# Patient Record
Sex: Male | Born: 1960 | Race: White | Hispanic: No
Health system: Southern US, Community
[De-identification: ages and names within clinical notes are randomized; demographics above are authoritative.]

## PROBLEM LIST (undated history)

## (undated) DIAGNOSIS — F329 Major depressive disorder, single episode, unspecified: Secondary | ICD-10-CM

## (undated) DIAGNOSIS — F419 Anxiety disorder, unspecified: Secondary | ICD-10-CM

## (undated) DIAGNOSIS — F32A Depression, unspecified: Secondary | ICD-10-CM

---

## 1999-09-01 ENCOUNTER — Ambulatory Visit (HOSPITAL_COMMUNITY): Admission: RE | Admit: 1999-09-01 | Discharge: 1999-09-01 | Payer: Self-pay | Admitting: Gastroenterology

## 1999-11-19 ENCOUNTER — Encounter: Admission: RE | Admit: 1999-11-19 | Discharge: 1999-11-19 | Payer: Self-pay | Admitting: Gastroenterology

## 1999-11-19 ENCOUNTER — Encounter: Payer: Self-pay | Admitting: Gastroenterology

## 2003-12-11 ENCOUNTER — Encounter: Admission: RE | Admit: 2003-12-11 | Discharge: 2003-12-11 | Payer: Self-pay | Admitting: Gastroenterology

## 2004-11-28 ENCOUNTER — Ambulatory Visit (HOSPITAL_COMMUNITY): Admission: RE | Admit: 2004-11-28 | Discharge: 2004-11-28 | Payer: Self-pay | Admitting: Gastroenterology

## 2013-09-22 ENCOUNTER — Other Ambulatory Visit: Payer: Self-pay

## 2017-11-26 ENCOUNTER — Emergency Department (HOSPITAL_COMMUNITY)
Admission: EM | Admit: 2017-11-26 | Discharge: 2017-11-26 | Disposition: A | Discharge status: Home / Self Care | Payer: BLUE CROSS/BLUE SHIELD | Attending: Emergency Medicine | Admitting: Emergency Medicine

## 2017-11-26 ENCOUNTER — Encounter (HOSPITAL_COMMUNITY): Payer: Self-pay

## 2017-11-26 ENCOUNTER — Emergency Department (HOSPITAL_COMMUNITY): Payer: BLUE CROSS/BLUE SHIELD

## 2017-11-26 ENCOUNTER — Other Ambulatory Visit: Payer: Self-pay

## 2017-11-26 DIAGNOSIS — Z23 Encounter for immunization: Secondary | ICD-10-CM | POA: Insufficient documentation

## 2017-11-26 DIAGNOSIS — Y9389 Activity, other specified: Secondary | ICD-10-CM | POA: Insufficient documentation

## 2017-11-26 DIAGNOSIS — Y999 Unspecified external cause status: Secondary | ICD-10-CM | POA: Insufficient documentation

## 2017-11-26 DIAGNOSIS — S80211A Abrasion, right knee, initial encounter: Secondary | ICD-10-CM | POA: Diagnosis not present

## 2017-11-26 DIAGNOSIS — Y929 Unspecified place or not applicable: Secondary | ICD-10-CM | POA: Insufficient documentation

## 2017-11-26 DIAGNOSIS — S50811A Abrasion of right forearm, initial encounter: Secondary | ICD-10-CM | POA: Diagnosis not present

## 2017-11-26 DIAGNOSIS — S59911A Unspecified injury of right forearm, initial encounter: Secondary | ICD-10-CM | POA: Diagnosis present

## 2017-11-26 HISTORY — DX: Depression, unspecified: F32.A

## 2017-11-26 HISTORY — DX: Major depressive disorder, single episode, unspecified: F32.9

## 2017-11-26 HISTORY — DX: Anxiety disorder, unspecified: F41.9

## 2017-11-26 MED ORDER — TETANUS-DIPHTH-ACELL PERTUSSIS 5-2.5-18.5 LF-MCG/0.5 IM SUSP
0.5000 mL | Freq: Once | INTRAMUSCULAR | Status: AC
  Administered 2017-11-26: 0.5 mL via INTRAMUSCULAR
  Filled 2017-11-26: qty 0.5

## 2017-11-26 MED ORDER — IBUPROFEN 800 MG PO TABS
800.0000 mg | ORAL_TABLET | Freq: Once | ORAL | Status: AC
  Administered 2017-11-26: 800 mg via ORAL
  Filled 2017-11-26: qty 1
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
## 2017-11-26 NOTE — ED Notes (Signed)
Discharge instructions reviewed with patient. Patient verbalizes understanding. VSS.   
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
## 2017-12-03 ENCOUNTER — Other Ambulatory Visit: Payer: Self-pay | Admitting: Family Medicine

## 2017-12-03 DIAGNOSIS — S0990XA Unspecified injury of head, initial encounter: Secondary | ICD-10-CM

## 2017-12-03 DIAGNOSIS — R42 Dizziness and giddiness: Secondary | ICD-10-CM

## 2017-12-03 DIAGNOSIS — R410 Disorientation, unspecified: Secondary | ICD-10-CM

## 2017-12-07 ENCOUNTER — Ambulatory Visit
Admission: RE | Admit: 2017-12-07 | Discharge: 2017-12-07 | Disposition: A | Discharge status: Home / Self Care | Payer: BLUE CROSS/BLUE SHIELD | Source: Ambulatory Visit | Attending: Family Medicine | Admitting: Family Medicine

## 2017-12-07 DIAGNOSIS — S0990XA Unspecified injury of head, initial encounter: Secondary | ICD-10-CM

## 2017-12-07 DIAGNOSIS — R42 Dizziness and giddiness: Secondary | ICD-10-CM

## 2017-12-07 DIAGNOSIS — R410 Disorientation, unspecified: Secondary | ICD-10-CM

## 2017-12-07 MED ORDER — GADOBENATE DIMEGLUMINE 529 MG/ML IV SOLN
20.0000 mL | Freq: Once | INTRAVENOUS | Status: AC | PRN
  Administered 2017-12-07: 20 mL via INTRAVENOUS

## 2018-06-24 DIAGNOSIS — R972 Elevated prostate specific antigen [PSA]: Secondary | ICD-10-CM | POA: Diagnosis not present

## 2018-07-01 DIAGNOSIS — N401 Enlarged prostate with lower urinary tract symptoms: Secondary | ICD-10-CM | POA: Diagnosis not present

## 2018-07-01 DIAGNOSIS — R972 Elevated prostate specific antigen [PSA]: Secondary | ICD-10-CM | POA: Diagnosis not present

## 2018-07-01 DIAGNOSIS — R350 Frequency of micturition: Secondary | ICD-10-CM | POA: Diagnosis not present

## 2018-07-15 DIAGNOSIS — R1031 Right lower quadrant pain: Secondary | ICD-10-CM | POA: Diagnosis not present

## 2018-07-20 ENCOUNTER — Other Ambulatory Visit: Payer: Self-pay | Admitting: Family Medicine

## 2018-07-20 DIAGNOSIS — R1031 Right lower quadrant pain: Secondary | ICD-10-CM

## 2018-08-05 ENCOUNTER — Other Ambulatory Visit: Payer: BLUE CROSS/BLUE SHIELD

## 2019-02-03 ENCOUNTER — Other Ambulatory Visit: Payer: Self-pay

## 2019-02-03 ENCOUNTER — Encounter (HOSPITAL_COMMUNITY): Payer: Self-pay | Admitting: Emergency Medicine

## 2019-02-03 ENCOUNTER — Other Ambulatory Visit: Payer: Self-pay | Admitting: Nurse Practitioner

## 2019-02-03 ENCOUNTER — Ambulatory Visit (HOSPITAL_BASED_OUTPATIENT_CLINIC_OR_DEPARTMENT_OTHER): Payer: 59

## 2019-02-03 ENCOUNTER — Emergency Department (HOSPITAL_COMMUNITY)
Admission: EM | Admit: 2019-02-03 | Discharge: 2019-02-04 | Disposition: A | Discharge status: Home / Self Care | Payer: 59 | Attending: Emergency Medicine | Admitting: Emergency Medicine

## 2019-02-03 ENCOUNTER — Other Ambulatory Visit (HOSPITAL_BASED_OUTPATIENT_CLINIC_OR_DEPARTMENT_OTHER): Payer: Self-pay | Admitting: Nurse Practitioner

## 2019-02-03 ENCOUNTER — Encounter (HOSPITAL_BASED_OUTPATIENT_CLINIC_OR_DEPARTMENT_OTHER): Payer: Self-pay

## 2019-02-03 DIAGNOSIS — R1011 Right upper quadrant pain: Secondary | ICD-10-CM

## 2019-02-03 DIAGNOSIS — Z79899 Other long term (current) drug therapy: Secondary | ICD-10-CM | POA: Diagnosis not present

## 2019-02-03 DIAGNOSIS — K8050 Calculus of bile duct without cholangitis or cholecystitis without obstruction: Secondary | ICD-10-CM | POA: Insufficient documentation

## 2019-02-03 LAB — CBC
HCT: 45.9 % (ref 39.0–52.0)
Hemoglobin: 15.9 g/dL (ref 13.0–17.0)
MCH: 31 pg (ref 26.0–34.0)
MCHC: 34.6 g/dL (ref 30.0–36.0)
MCV: 89.5 fL (ref 80.0–100.0)
Platelets: 203 10*3/uL (ref 150–400)
RBC: 5.13 MIL/uL (ref 4.22–5.81)
RDW: 12.1 % (ref 11.5–15.5)
WBC: 7 10*3/uL (ref 4.0–10.5)
nRBC: 0 % (ref 0.0–0.2)

## 2019-02-03 LAB — URINALYSIS, ROUTINE W REFLEX MICROSCOPIC
Bacteria, UA: NONE SEEN
Bilirubin Urine: NEGATIVE
Glucose, UA: >=500 mg/dL — AB
Hgb urine dipstick: NEGATIVE
Ketones, ur: NEGATIVE mg/dL
Nitrite: NEGATIVE
Protein, ur: NEGATIVE mg/dL
Specific Gravity, Urine: 1.028 (ref 1.005–1.030)
pH: 5 (ref 5.0–8.0)

## 2019-02-03 LAB — COMPREHENSIVE METABOLIC PANEL
ALT: 27 U/L (ref 0–44)
AST: 21 U/L (ref 15–41)
Albumin: 4 g/dL (ref 3.5–5.0)
Alkaline Phosphatase: 58 U/L (ref 38–126)
Anion gap: 10 (ref 5–15)
BUN: 24 mg/dL — ABNORMAL HIGH (ref 6–20)
CO2: 21 mmol/L — ABNORMAL LOW (ref 22–32)
Calcium: 9.2 mg/dL (ref 8.9–10.3)
Chloride: 106 mmol/L (ref 98–111)
Creatinine, Ser: 1.1 mg/dL (ref 0.61–1.24)
GFR calc Af Amer: >60 mL/min (ref >60)
GFR calc non Af Amer: >60 mL/min (ref >60)
Glucose, Bld: 245 mg/dL — ABNORMAL HIGH (ref 70–99)
Potassium: 3.9 mmol/L (ref 3.5–5.1)
Sodium: 137 mmol/L (ref 135–145)
Total Bilirubin: 0.4 mg/dL (ref 0.3–1.2)
Total Protein: 6.6 g/dL (ref 6.5–8.1)

## 2019-02-03 LAB — LIPASE, BLOOD: Lipase: 45 U/L (ref 11–51)

## 2019-02-03 MED ORDER — SODIUM CHLORIDE 0.9% FLUSH: 3.0000 mL | Freq: Once | INTRAVENOUS | Status: DC

## 2019-02-03 NOTE — ED Triage Notes (Signed)
Pt c/o RUQ pain since Monday night. Endorses nausea/vomiting after eating.

## 2019-02-04 ENCOUNTER — Emergency Department (HOSPITAL_COMMUNITY): Payer: 59

## 2019-02-04 MED ORDER — METOCLOPRAMIDE HCL 10 MG PO TABS: 10.0000 mg | ORAL_TABLET | Freq: Four times a day (QID) | ORAL | 0 refills | Status: AC

## 2019-02-04 MED ORDER — ONDANSETRON 4 MG PO TBDP
8.0000 mg | ORAL_TABLET | Freq: Once | ORAL | Status: AC
  Administered 2019-02-04: 8 mg via ORAL
  Filled 2019-02-04: qty 2

## 2019-02-04 MED ORDER — DICYCLOMINE HCL 20 MG PO TABS: 20.0000 mg | ORAL_TABLET | Freq: Two times a day (BID) | ORAL | 0 refills | Status: AC | PRN

## 2019-02-04 NOTE — ED Provider Notes (Signed)
[facility] Provider Note   CSN: [id number] Arrival date & time: 02/03/19  1803     History   Chief Complaint Chief Complaint  Patient presents with  . Abdominal Pain    HPI ROBER Marks is a 58 y.o. male.     58 year old male presents to the emergency department for evaluation of right upper quadrant abdominal pain which began 5 days ago.  It is intermittent and waxing and waning in severity.  Is usually present 1 hour after eating or drinking.  He has had associated nausea and vomiting with his last episode of emesis on Tuesday.  Denies taking any medications for his pain, but has been told that he has gallstones in the past.  No fevers, hematemesis, bowel changes, urinary symptoms.  No history of abdominal surgeries.  The history is provided by the patient. No language interpreter was used.  Abdominal Pain   Past Medical History:  Diagnosis Date  . Anxiety   . Depression     There are no active problems to display for this patient.   History reviewed. No pertinent surgical history.      Home Medications    Prior to Admission medications   Medication Sig Start Date End Date Taking? Authorizing Provider  ALPRAZolam Duanne Moron) 0.5 MG tablet Take 0.5 mg by mouth 2 (two) times daily as needed. 11/02/17   [provider]  dicyclomine (BENTYL) 20 MG tablet Take 1 tablet (20 mg total) by mouth every 12 (twelve) hours as needed (for abdominal pain/cramping). 02/04/19   Antonietta Breach, PA-C  metoCLOPramide (REGLAN) 10 MG tablet Take 1 tablet (10 mg total) by mouth every 6 (six) hours. 02/04/19   Antonietta Breach, PA-C  pantoprazole (PROTONIX) 20 MG tablet Take 20 mg by mouth daily. 09/14/17   [provider]  sertraline (ZOLOFT) 50 MG tablet Take 50 mg by mouth daily.    [provider]    Family History Family History  Problem Relation Age of Onset  . Cancer Mother   . Heart failure Father     Social History  Social History   Tobacco Use  . Smoking status: Never Smoker  . Smokeless tobacco: Never Used  Substance Use Topics  . Alcohol use: Yes    Comment: occasionally  . Drug use: Never     Allergies   Tobramycin   Review of Systems Review of Systems  Gastrointestinal: Positive for abdominal pain.   Ten systems reviewed and are negative for acute change, except as noted in the HPI.    Physical Exam Updated Vital Signs BP 132/88 (BP Location: Right Arm)   Pulse 70   Temp 98 F (36.7 C) (Oral)   Resp 18   SpO2 98%   Physical Exam Vitals signs and nursing note reviewed.  Constitutional:      General: He is not in acute distress.    Appearance: He is well-developed. He is not diaphoretic.     Comments: Nontoxic appearing and in NAD  HENT:     Head: Normocephalic and atraumatic.  Eyes:     General: No scleral icterus.    Conjunctiva/sclera: Conjunctivae normal.  Neck:     Musculoskeletal: Normal range of motion.  Pulmonary:     Effort: Pulmonary effort is normal. No respiratory distress.  Abdominal:     Comments: Mild RUQ TTP without guarding or peritoneal signs. Negative Murphy's sign. No palpable masses.  Musculoskeletal: Normal range of motion.  Skin:  General: Skin is warm and dry.     Coloration: Skin is not pale.     Findings: No erythema or rash.  Neurological:     Mental Status: He is alert and oriented to person, place, and time.     Coordination: Coordination normal.  Psychiatric:        Behavior: Behavior normal.      ED Treatments / Results  Labs (all labs ordered are listed, but only abnormal results are displayed) Labs Reviewed  COMPREHENSIVE METABOLIC PANEL - Abnormal; Notable for the following components:      Result Value   CO2 21 (*)    Glucose, Bld 245 (*)    BUN 24 (*)    All other components within normal limits  URINALYSIS, ROUTINE W REFLEX MICROSCOPIC - Abnormal; Notable for the following components:   Glucose, UA >=500 (*)     Leukocytes,Ua TRACE (*)    All other components within normal limits  LIPASE, BLOOD  CBC    EKG None  Radiology [place] Abdomen Limited  Result Date: 02/04/2019 CLINICAL DATA:  Right upper quadrant abdominal pain EXAM: ULTRASOUND ABDOMEN LIMITED RIGHT UPPER QUADRANT COMPARISON:  Ultrasound dated December 11, 2003 could not be retrieved for comparison. The report is available for review. FINDINGS: Gallbladder: There is cholelithiasis without gallbladder wall thickening or pericholecystic free fluid. The sonographic Eulah PontMurphy sign is reported as negative. Common bile duct: Diameter: 4 mm Liver: Diffuse increased echogenicity with slightly heterogeneous liver. Appearance typically secondary to fatty infiltration. Fibrosis secondary consideration. No secondary findings of cirrhosis noted. No focal hepatic lesion or intrahepatic biliary duct dilatation. Portal vein is patent on color Doppler imaging with normal direction of blood flow towards the liver. Other: None. IMPRESSION: 1. There is cholelithiasis without secondary signs of acute cholecystitis. 2. Hepatic steatosis. Electronically Signed   By: Katherine Mantlehristopher  Green M.D.   On: 02/04/2019 01:05    Procedures Procedures (including critical care time)  Medications Ordered in ED Medications  sodium chloride flush (NS) 0.9 % injection 3 mL (has no administration in time range)  ondansetron (ZOFRAN-ODT) disintegrating tablet 8 mg (8 mg Oral Given 02/04/19 0208)     Initial Impression / Assessment and Plan / ED Course  I have reviewed the triage vital signs and the nursing notes.  Pertinent labs & imaging results that were available during my care of the patient were reviewed by me and considered in my medical decision making (see chart for details).        58 year old male presents to the emergency department for evaluation of right upper quadrant abdominal pain x 5 days.  Pain is largely postprandial with occasional nausea and vomiting.  He had mild  tenderness in the right upper quadrant.  Negative Murphy sign today.  No associated fevers.  Vital signs have been stable.  Labs also reassuring without leukocytosis.  Liver and kidney function preserved.  An ultrasound was completed which shows gallstones.  There is no wall thickening or pericholecystic fluid.  His symptoms today are consistent with biliary colic.  I have recommended outpatient management with antiemetics and Bentyl.  He will be given referral to general surgery to discuss possible elective cholecystectomy.  Return precautions discussed and provided. Patient discharged in stable condition with no unaddressed concerns.   Final Clinical Impressions(s) / ED Diagnoses   Final diagnoses:  Biliary colic    ED Discharge Orders         Ordered    metoCLOPramide (REGLAN) 10 MG tablet  Every 6 hours     02/04/19 0224    dicyclomine (BENTYL) 20 MG tablet  Every 12 hours PRN     02/04/19 0224           Antony MaduraHumes, Kynlie Jane, PA-C 02/04/19 0410    Mesner, Barbara CowerJason, MD 02/04/19 [phone]

## 2019-02-04 NOTE — Discharge Instructions (Signed)
Your work-up in the emergency department was reassuring.  You do have evidence of gallstones, but no infection to your gallbladder.  Your liver tests were normal.  We recommend follow-up with general surgery to discuss elective cholecystectomy.  You may take Reglan as needed for persistent nausea and Bentyl as prescribed for management of pain.  Try to avoid fatty, greasy, fried foods as this will exacerbate your symptoms.  You may return to the ED if symptoms persist or worsen.

## 2019-02-06 ENCOUNTER — Other Ambulatory Visit: Payer: Self-pay

## 2019-02-07 ENCOUNTER — Other Ambulatory Visit: Payer: Self-pay | Admitting: Nurse Practitioner

## 2019-02-21 ENCOUNTER — Other Ambulatory Visit: Payer: Self-pay | Admitting: General Surgery

## 2020-03-25 IMAGING — US ULTRASOUND ABDOMEN LIMITED
1 series · 14 of 25 positions shown · non-contrast
Comparison: Ultrasound dated December 11, 2003 could not be retrieved
for comparison. The report is available for review.

CLINICAL DATA: Right upper quadrant abdominal pain

EXAM:
ULTRASOUND ABDOMEN LIMITED RIGHT UPPER QUADRANT

[Series 1: ultrasound abdomen limited · 14 of 61 slices shown]
[im 1/61]
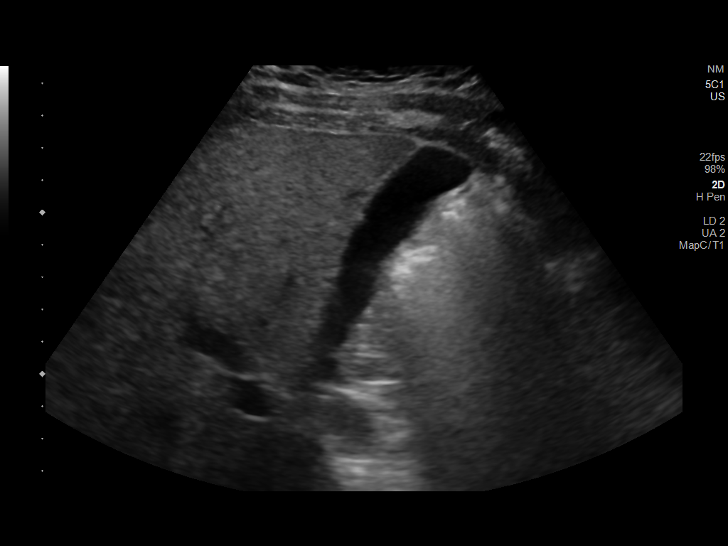
[im 6/61]
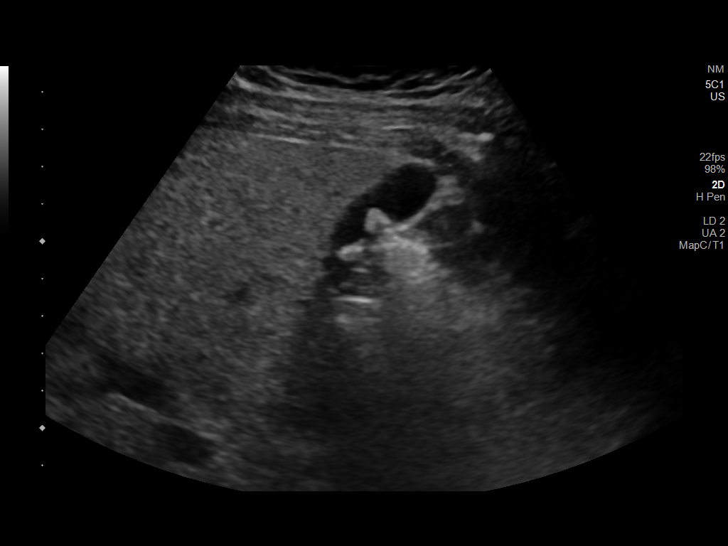
[im 11/61]
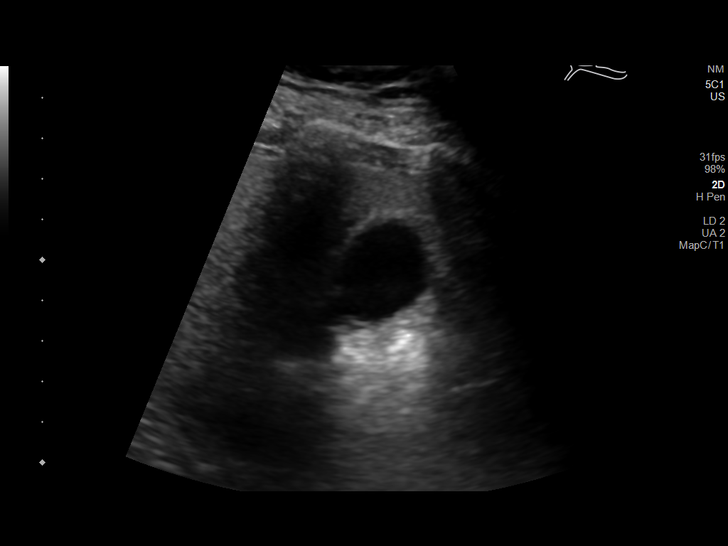
[im 16/61]
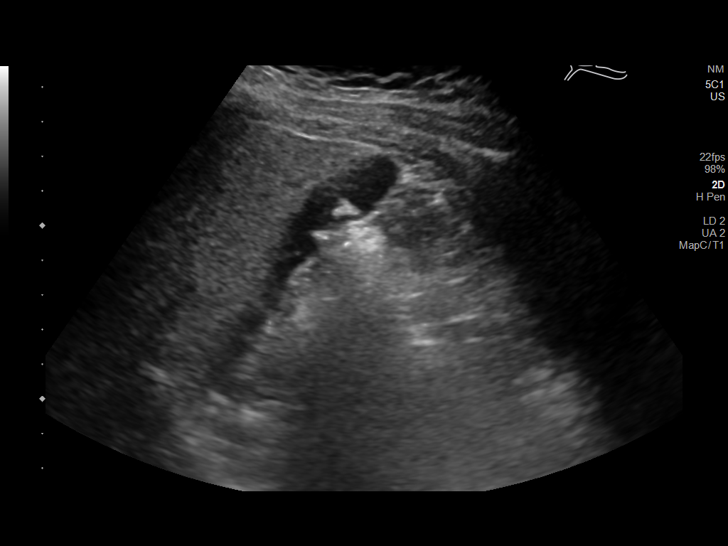
[im 21/61]
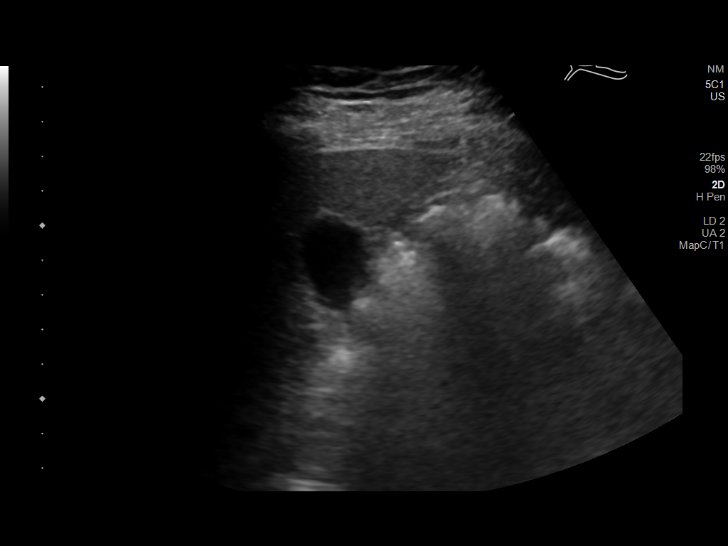
[im 23/61]
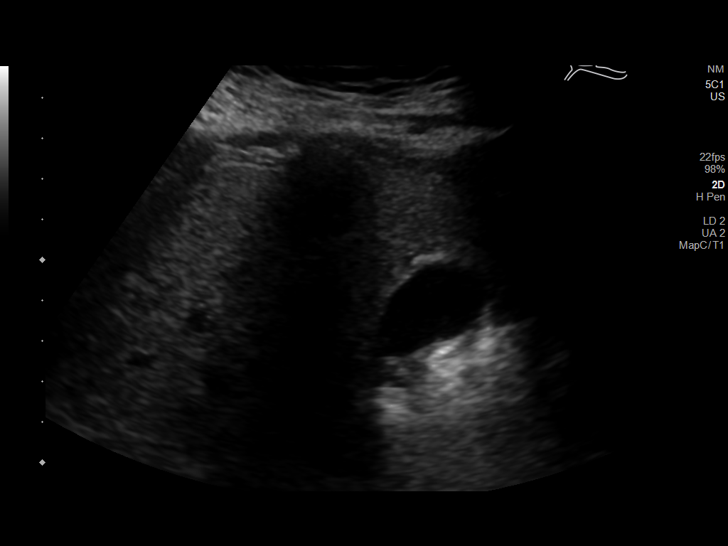
[im 28/61]
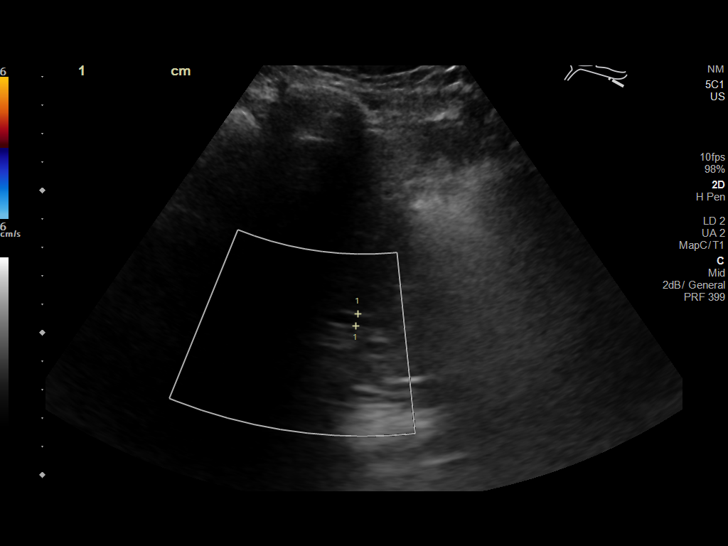
[im 33/61]
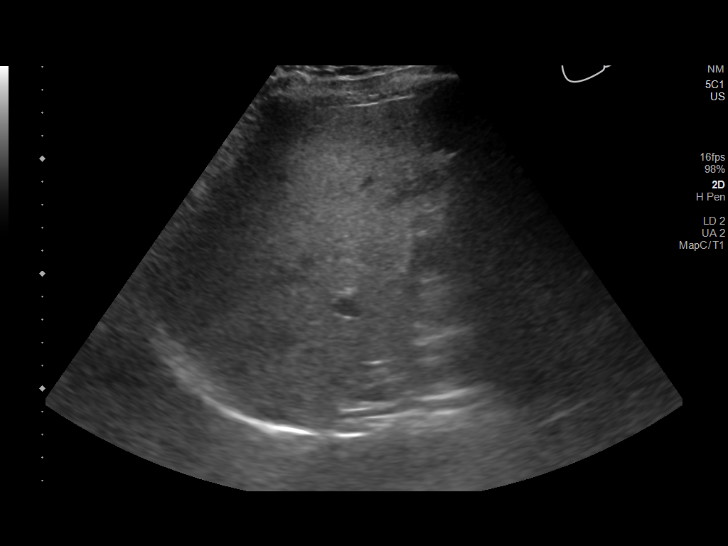
[im 38/61]
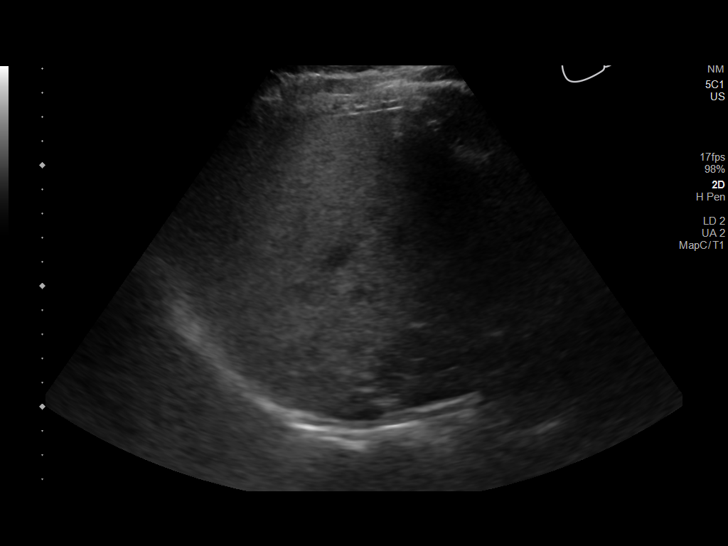
[im 41/61]
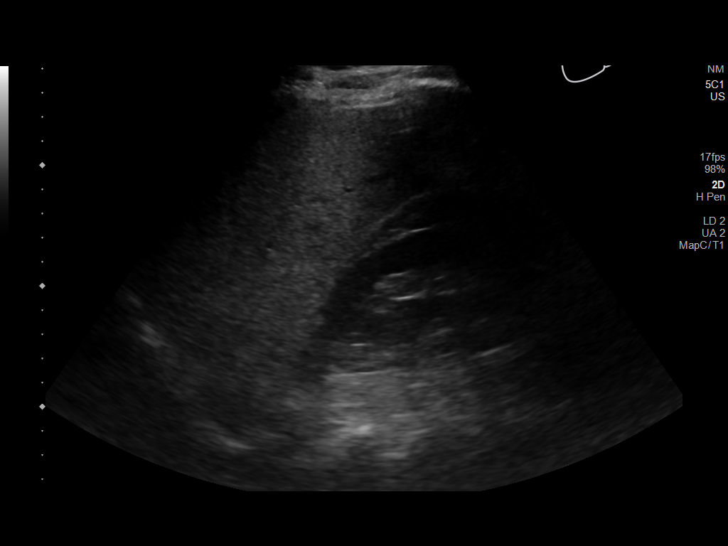
[im 46/61]
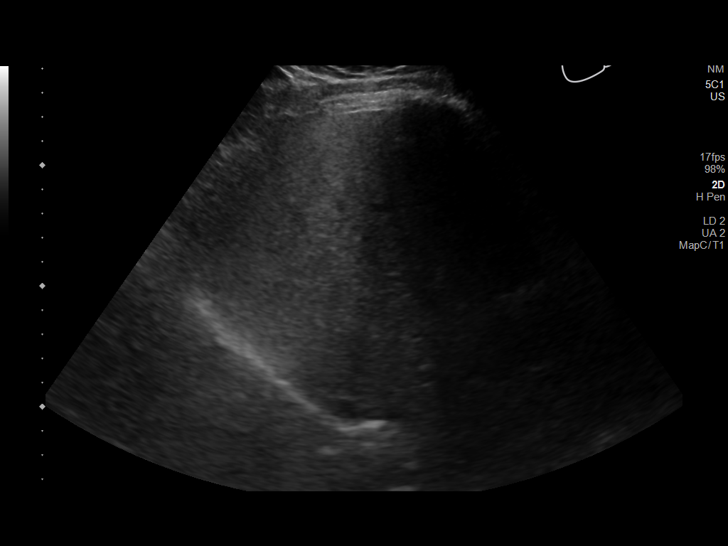
[im 51/61]
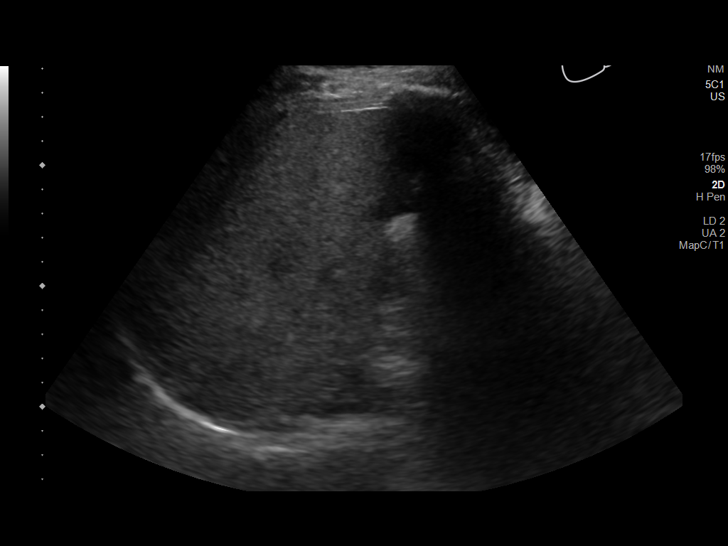
[im 56/61]
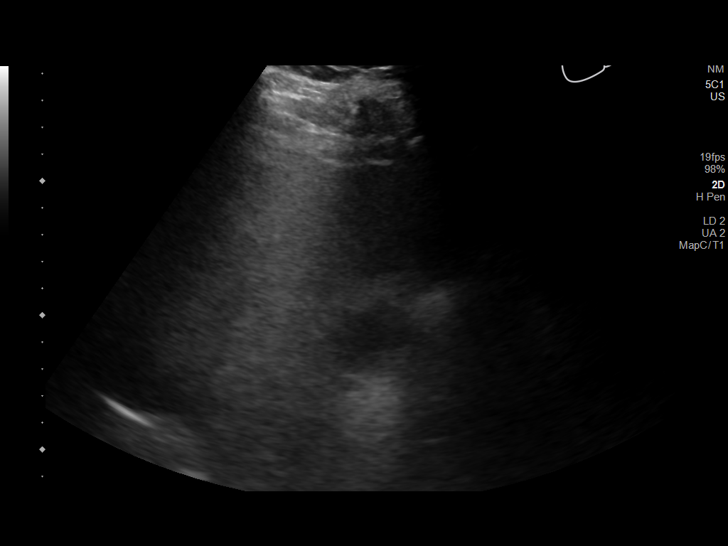
[im 61/61]
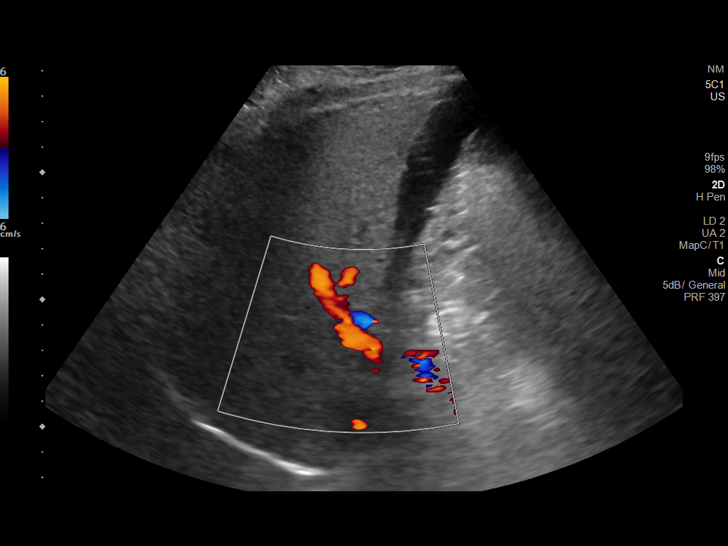

[14 of 25 positions shown; findings below may reference images not displayed]

FINDINGS: Gallbladder:

There is cholelithiasis without gallbladder wall thickening or
pericholecystic free fluid. The sonographic Murphy sign is reported
as negative.

Common bile duct:

Diameter: 4 mm

Liver:

Diffuse increased echogenicity with slightly heterogeneous liver.
Appearance typically secondary to fatty infiltration. Fibrosis
secondary consideration. No secondary findings of cirrhosis noted.
No focal hepatic lesion or intrahepatic biliary duct dilatation.
Portal vein is patent on color Doppler imaging with normal direction
of blood flow towards the liver.

Other: None.
IMPRESSION: 1. There is cholelithiasis without secondary signs of acute
cholecystitis.
2. Hepatic steatosis.
